# Patient Record
Sex: Female | Born: 1985 | Race: White | Hispanic: No | Marital: Single | Smoking: Former smoker
Health system: Southern US, Community
[De-identification: ages and names within clinical notes are randomized; demographics above are authoritative.]

## PROBLEM LIST (undated history)

## (undated) DIAGNOSIS — N83209 Unspecified ovarian cyst, unspecified side: Secondary | ICD-10-CM

## (undated) DIAGNOSIS — IMO0002 Reserved for concepts with insufficient information to code with codable children: Secondary | ICD-10-CM

## (undated) DIAGNOSIS — K661 Hemoperitoneum: Secondary | ICD-10-CM

## (undated) HISTORY — DX: Unspecified ovarian cyst, unspecified side: N83.209

## (undated) HISTORY — PX: BREAST SURGERY: SHX581

## (undated) HISTORY — DX: Hemoperitoneum: K66.1

---

## 2003-02-03 HISTORY — PX: BREAST BIOPSY: SHX20

## 2004-02-03 HISTORY — PX: DILATION AND CURETTAGE OF UTERUS: SHX78

## 2010-02-02 HISTORY — PX: ABDOMINAL EXPLORATION SURGERY: SHX538

## 2010-02-02 HISTORY — PX: BREAST ENHANCEMENT SURGERY: SHX7

## 2010-09-07 ENCOUNTER — Emergency Department (HOSPITAL_COMMUNITY): Payer: PPO

## 2010-09-07 ENCOUNTER — Inpatient Hospital Stay (HOSPITAL_COMMUNITY)
Admission: EM | Admit: 2010-09-07 | Discharge: 2010-09-11 | DRG: 742 | Disposition: A | Discharge status: Home / Self Care | Payer: PPO | Attending: General Surgery | Admitting: General Surgery

## 2010-09-07 DIAGNOSIS — D500 Iron deficiency anemia secondary to blood loss (chronic): Secondary | ICD-10-CM | POA: Diagnosis present

## 2010-09-07 DIAGNOSIS — K661 Hemoperitoneum: Secondary | ICD-10-CM

## 2010-09-07 DIAGNOSIS — R51 Headache: Secondary | ICD-10-CM | POA: Diagnosis present

## 2010-09-07 DIAGNOSIS — R109 Unspecified abdominal pain: Secondary | ICD-10-CM

## 2010-09-07 DIAGNOSIS — N83209 Unspecified ovarian cyst, unspecified side: Principal | ICD-10-CM | POA: Diagnosis present

## 2010-09-07 HISTORY — DX: Hemoperitoneum: K66.1

## 2010-09-07 LAB — CBC
HCT: 27.7 % — ABNORMAL LOW (ref 36.0–46.0)
HCT: 21.1 % — ABNORMAL LOW (ref 36.0–46.0)
Hemoglobin: 10 g/dL — ABNORMAL LOW (ref 12.0–15.0)
MCV: 84.1 fL (ref 78.0–100.0)
Platelets: 150 10*3/uL (ref 150–400)
RBC: 2.51 MIL/uL — ABNORMAL LOW (ref 3.87–5.11)
RDW: 12.1 % (ref 11.5–15.5)
WBC: 13.9 10*3/uL — ABNORMAL HIGH (ref 4.0–10.5)
WBC: 12.3 10*3/uL — ABNORMAL HIGH (ref 4.0–10.5)

## 2010-09-07 LAB — COMPREHENSIVE METABOLIC PANEL
AST: 11 U/L (ref 0–37)
Albumin: 3.3 g/dL — ABNORMAL LOW (ref 3.5–5.2)
Alkaline Phosphatase: 34 U/L — ABNORMAL LOW (ref 39–117)
BUN: 16 mg/dL (ref 6–23)
Chloride: 109 mEq/L (ref 96–112)
Potassium: 4.7 mEq/L (ref 3.5–5.1)
Total Bilirubin: 0.5 mg/dL (ref 0.3–1.2)

## 2010-09-07 LAB — DIFFERENTIAL
Basophils Absolute: 0 10*3/uL (ref 0.0–0.1)
Eosinophils Relative: 0 % (ref 0–5)
Lymphocytes Relative: 5 % — ABNORMAL LOW (ref 12–46)
Neutro Abs: 12.6 10*3/uL — ABNORMAL HIGH (ref 1.7–7.7)
Neutrophils Relative %: 91 % — ABNORMAL HIGH (ref 43–77)

## 2010-09-07 LAB — WET PREP, GENITAL

## 2010-09-07 LAB — URINALYSIS, ROUTINE W REFLEX MICROSCOPIC
Glucose, UA: NEGATIVE mg/dL
Leukocytes, UA: NEGATIVE
Specific Gravity, Urine: 1.033 — ABNORMAL HIGH (ref 1.005–1.030)
pH: 6 (ref 5.0–8.0)

## 2010-09-07 LAB — PREGNANCY, URINE: Preg Test, Ur: NEGATIVE

## 2010-09-07 LAB — LIPASE, BLOOD: Lipase: 18 U/L (ref 11–59)

## 2010-09-07 MED ORDER — IOHEXOL 300 MG/ML  SOLN
100.0000 mL | Freq: Once | INTRAMUSCULAR | Status: AC | PRN
  Administered 2010-09-07: 100 mL via INTRAVENOUS

## 2010-09-08 DIAGNOSIS — D62 Acute posthemorrhagic anemia: Secondary | ICD-10-CM

## 2010-09-08 LAB — CBC
Hemoglobin: 6.7 g/dL — CL (ref 12.0–15.0)
MCH: 29.8 pg (ref 26.0–34.0)
MCV: 82.2 fL (ref 78.0–100.0)
RBC: 2.25 MIL/uL — ABNORMAL LOW (ref 3.87–5.11)
WBC: 8.8 10*3/uL (ref 4.0–10.5)

## 2010-09-08 LAB — GC/CHLAMYDIA PROBE AMP, GENITAL
Chlamydia, DNA Probe: NEGATIVE
GC Probe Amp, Genital: NEGATIVE

## 2010-09-08 LAB — HEMOGLOBIN AND HEMATOCRIT, BLOOD
Hemoglobin: 6.7 g/dL — CL (ref 12.0–15.0)
Hemoglobin: 6.5 g/dL — CL (ref 12.0–15.0)

## 2010-09-09 LAB — CBC
MCH: 30 pg (ref 26.0–34.0)
MCHC: 35.6 g/dL (ref 30.0–36.0)
MCV: 84.3 fL (ref 78.0–100.0)
Platelets: 111 10*3/uL — ABNORMAL LOW (ref 150–400)
RDW: 12.8 % (ref 11.5–15.5)
WBC: 4.6 10*3/uL (ref 4.0–10.5)

## 2010-09-09 LAB — BASIC METABOLIC PANEL
BUN: 6 mg/dL (ref 6–23)
Calcium: 7.9 mg/dL — ABNORMAL LOW (ref 8.4–10.5)
Chloride: 106 mEq/L (ref 96–112)
Creatinine, Ser: 0.71 mg/dL (ref 0.50–1.10)
GFR calc Af Amer: >60 mL/min (ref >60)
GFR calc non Af Amer: >60 mL/min (ref >60)

## 2010-09-09 NOTE — Op Note (Signed)
NAMEMarland Kitchen  ASIANA, BENNINGER NO.:  [id number]  MEDICAL RECORD NO.:  [id number]  LOCATION:  1230                         FACILITY:  [facility]  PHYSICIAN:  Sandria Bales. Ezzard Standing, M.D.  DATE OF BIRTH:  09-25-1985  DATE OF PROCEDURE:  09/07/2010                              OPERATIVE REPORT  PREOPERATIVE DIAGNOSIS:  Hemoperitoneum.  POSTOP DIAGNOSIS:  Hemoperitoneum secondary to ruptured right ovarian cyst, 2500 mL of blood in abdominal cavity.  PROCEDURE:  Laparoscopic exploration with evacuation of hemoperitoneum, oversew of ruptured right ovarian cyst.  SURGEON:  Sandria Bales. Ezzard Standing, M.D.  FIRST ASSISTANT:  None.  ANESTHESIA:  General with 20 mL of 0.25% Marcaine.  COMPLICATIONS:  None.  INDICATION OF PROCEDURE:  Ms. Art is a 25 year old white female who is a patient of Dr. Marylene Land of [place] who presented with an acute onset of abdominal pain, hypotension, and a CT scan suggested of a hemoperitoneum.  I discussed with the patient and her boyfriend about proceeding with laparoscopic exploration.  I thought preoperatively this was probably a GYN source, and thought she would be best served by doing a laparoscopic exploration of two things. 1. To evacuate the hematoma. 2. To make a definitive diagnosis.  The risks of surgery include infection, open abdominal laparotomy, may not find the source of bleeding or stop the source of bleeding.  OPERATIVE NOTE:  Dr. Eilene Ghazi was supervising anesthesia.  The patient in room #1, she was placed in the lithotomy position and had a Foley catheter in place and a G-tube in place, given 1 g of Ancef.  The time-out was held and surgical checklist run.  I accessed the abdominal cavity through the infraumbilical incision with sharp dissection carried down the abdominal cavity.  I placed a 10 mm laparoscope through a 12 mm Hasson trocar, secured this up over with a 0 Vicryl suture.  I placed a 5 mm trocar in the right  lateral abdomen, 5 mm trocar in the left lateral abdomen.  I did an abdominal exploration. Upon entering the abdomen, the patient had a lot of blood in her abdomen.  I placed the patient in Trendelenburg position and evacuated the blood out of her pelvis. Most of the clotted blood centered around the right ovary.  I used both the larger sucker tip and the smaller sucker to get the blood out.  I then sucked out over the liver.  The liver looked good.  The gallbladder looked good.  The stomach looked good.  I sucked out over the spleen and I could see the anterior portion of the spleen and that also looked good.  Without any irrigation, I obtained about 2500 mL of clotted blood.  At this point, I started irrigating to define better the anatomy and it looked like her at the dome of the right ovary, she had an approximate 1 cm ruptured cyst which was bleeding.  I think this is the source of her blood loss.  I tried to cauterize this, but it was still dripping little bit of blood, so I used a 2-0 Vicryl suture in a figure-of-eight fashion, placed a piece of Surgicel under the stitch and sewed this  to the right ovary.  This stopped the bleeding.  I then irrigated the abdomen with about 3 L of saline, trying to get all clot blood out.  I re-examined the pelvis.  The right ovary was unremarkable.  The uterus was unremarkable.  The colon that I could see was unremarkable.  The bowel that I could see was unremarkable.  The omentum that I could see was unremarkable.  The right and left lobes of liver unremarkable.  The spleen was unremarkable.  The anterior wall of the stomach unremarkable. Again, I think the source of bleeding was the right ovary.  The trocar was then removed and turned, the umbilical port closed with 0 Vicryl suture.  The skin, I used about 20 mL of 4% Marcaine as a local anesthetic at the umbilicus and both small incisions.  Closed the skin with a 5-0 Vicryl suture, used  Dermabond on the skin.  The patient tolerated the procedure well, we will check a blood count recovery and follow her hemoglobin.   Sandria Bales. Ezzard Standing, M.D., FACS   DHN/MEDQ  D:  09/07/2010  T:  09/08/2010  Job:  [id number]  cc:   Marylene Land, [facility] Kathryne Sharper  Electronically Signed by Ovidio Kin M.D. on 09/09/2010 04:00:23 PM
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
## 2010-09-10 LAB — BASIC METABOLIC PANEL
Calcium: 8.4 mg/dL (ref 8.4–10.5)
GFR calc Af Amer: >60 mL/min (ref >60)
GFR calc non Af Amer: >60 mL/min (ref >60)
Potassium: 3.7 mEq/L (ref 3.5–5.1)
Sodium: 140 mEq/L (ref 135–145)

## 2010-09-10 LAB — CBC
MCH: 29.9 pg (ref 26.0–34.0)
MCHC: 35.3 g/dL (ref 30.0–36.0)
RDW: 12.3 % (ref 11.5–15.5)

## 2010-09-11 LAB — CROSSMATCH
ABO/RH(D): A POS
Antibody Screen: NEGATIVE
Unit division: 0
Unit division: 0

## 2010-09-22 ENCOUNTER — Encounter (INDEPENDENT_AMBULATORY_CARE_PROVIDER_SITE_OTHER): Payer: Self-pay | Admitting: Surgery

## 2010-09-23 ENCOUNTER — Encounter (INDEPENDENT_AMBULATORY_CARE_PROVIDER_SITE_OTHER): Payer: Self-pay | Admitting: Surgery

## 2010-09-23 NOTE — Discharge Summary (Signed)
NAMEMarland Erickson  Erickson, Katherine NO.:  [id number]  MEDICAL RECORD NO.:  [id number]  LOCATION:  1230                         FACILITY:  [facility]  PHYSICIAN:  Lodema Pilot, MD       DATE OF BIRTH:  30-May-1985  DATE OF ADMISSION:  09/07/2010 DATE OF DISCHARGE:  09/11/2010                              DISCHARGE SUMMARY   ADMISSION DIAGNOSIS:  Abdominal pain with hemoperitoneum.  DISCHARGE DIAGNOSIS:  Hemoperitoneum secondary to ruptured right ovarian cyst with 2500 mL blood in the abdominal cavity.  PROCEDURES:  Laparoscopic exploration and evacuation of hemoperitoneum and oversewing of right ruptured ovarian cyst on September 07, 2010, by Dr. Ezzard Standing.  BRIEF HISTORY:  The patient is a 25 year old white female, followed by Dr. Marylene Land in [facility].  Night of admission, she thought she was having some menstrual cramps.  She tried some ibuprofen, which did not help.  The following morning, she collapsed in the Erickson, but she tried to get up and collapse again.  She finally called EMS around 12 o'clock, at 1 was brought to the emergency room at [facility].  She had abdominal distention and discomfort with no fever.  She was seen in evaluation by Dr. Ezzard Standing and he recommended she go to the OR emergently for exploration.  She has a history of miscarriage about 6 years ago, underwent a D and C while in [place].  She  has a 3-year- old girl, born by vaginal delivery in [place].  For further history and physical, please see the dictated note.  HOSPITAL COURSE:  The patient was admitted, taken to the OR with the above-noted findings.  The hemorrhage was controlled and the patient was transferred back to the ICU.  She was seen the following morning by Dr. Biagio Quint.  She was hemodynamically stable, but had a fairly significant anemia with hemoglobin 6.7 and hematocrit of 18.5.  We followed her hemoglobin throughout the day and it did not drop  significantly.  It was 6.3 and 17.7 on September 08, 2008, and the last repeat done on August 8 was 6.7, hemoglobin of 19, her electrolytes are normal.  She had remained hemodynamically stable.  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  She does complain of headache, which has improved, but it was her major problem, initially, she also is easily fatigued with any activity including eating right now.  Dr. Biagio Quint considered transfusion, but it was his opinion that she was hemodynamically stable and it was not necessary at this time.  The patient has made slow steady progress.  She has been mobilized.  Her blood pressure still runs between 98/41 up to 112/53.  She is tired, but her incisions looked good.  She is passing gas, tolerating p.o.'s well, and was his opinion she be discharged home on September 11, 2010.  The patient has been placed on multivitamin and iron.  We gave her ibuprofen and Vicodin p.r.n. for pain.  She will return on September 25, 2010, follow up with Dr. Ezzard Standing.  We recommended she return to her family practice doctor and let him follow her hemoglobin/hematocrit.  It was recommended she use milk of magnesia or MiraLax p.r.n. if she develops constipation especially while on the iron products.  CONDITION ON DISCHARGE:  Improved.     Eber Hong, P.A.   ______________________________ Lodema Pilot, MD    WDJ/MEDQ  D:  09/11/2010  T:  09/12/2010  Job:  [id number]  cc:   Dr. Marylene Land  Electronically Signed by Sherrie George P.A. on 09/19/2010 11:21:25 PM Electronically Signed by Lodema Pilot DO on 09/23/2010 09:44:27 AM

## 2010-09-24 ENCOUNTER — Encounter (INDEPENDENT_AMBULATORY_CARE_PROVIDER_SITE_OTHER): Payer: PPO | Admitting: Surgery

## 2010-09-24 ENCOUNTER — Encounter (INDEPENDENT_AMBULATORY_CARE_PROVIDER_SITE_OTHER): Payer: Self-pay | Admitting: Surgery

## 2010-10-09 ENCOUNTER — Encounter (INDEPENDENT_AMBULATORY_CARE_PROVIDER_SITE_OTHER): Payer: Self-pay | Admitting: Surgery

## 2010-10-17 ENCOUNTER — Encounter (INDEPENDENT_AMBULATORY_CARE_PROVIDER_SITE_OTHER): Payer: Self-pay | Admitting: Surgery

## 2010-10-17 ENCOUNTER — Ambulatory Visit (INDEPENDENT_AMBULATORY_CARE_PROVIDER_SITE_OTHER): Payer: PPO | Admitting: Surgery

## 2010-10-17 VITALS — BP 90/60 | HR 66 | Ht 64.0 in | Wt 154.0 lb

## 2010-10-17 DIAGNOSIS — K661 Hemoperitoneum: Secondary | ICD-10-CM

## 2010-10-17 NOTE — Progress Notes (Signed)
Assessment and Plan: 1.  Hemoperitoneum secondary to ruptured right ovarian cyst  Doing well.  2. Some headaches.  About weekly.  HPI:  Katherine Erickson is a 25 year old white female who is a patient of Katherine Erickson, [place], of Katherine Erickson who presented with an acute onset of abdominal pain, hypotension, and a CT scan suggested of a  Hemoperitoneum.  I did a laparoscopy on her 09/07/2010 and evacuated the hemoperitoneum.  She had a bleeding right ovarian cysts.  Her post op course has been uneventful.  She had a period about 4 days after leaving the hospital.  She says her appetite is good (in fact, it is very good).  PE: BP 90/60  Pulse 66  Ht 5\' 4"  (1.626 m)  Wt 154 lb (69.854 kg)  BMI 26.43 kg/m2  Abdomen:  All incisions well healed.  Soft.

## 2010-12-16 NOTE — Progress Notes (Signed)
This encounter was created in error - please disregard.

## 2012-02-03 HISTORY — PX: COLPOSCOPY: SHX161

## 2012-02-03 NOTE — L&D Delivery Note (Signed)
Delivery Note  SVD viable female Apgars 8,9 over intact perineum.  Placenta delivered spontaneously intact with 3VC. good support and hemostasis noted and R/V exam confirms.  PH art was sent.  [place] cord blood was not done.  Mother and baby were doing well.  EBL 300cc  Candice Camp, MD

## 2012-04-27 LAB — OB RESULTS CONSOLE RUBELLA ANTIBODY, IGM: Rubella: NON-IMMUNE/NOT IMMUNE

## 2012-04-27 LAB — OB RESULTS CONSOLE HIV ANTIBODY (ROUTINE TESTING): HIV: NONREACTIVE

## 2012-04-27 LAB — OB RESULTS CONSOLE ABO/RH: RH Type: POSITIVE

## 2012-11-19 ENCOUNTER — Encounter (HOSPITAL_COMMUNITY): Payer: Self-pay | Admitting: *Deleted

## 2012-11-19 ENCOUNTER — Inpatient Hospital Stay (HOSPITAL_COMMUNITY)
Admission: AD | Admit: 2012-11-19 | Discharge: 2012-11-21 | DRG: 775 | Disposition: A | Discharge status: Home / Self Care | Payer: Managed Care, Other (non HMO) | Source: Ambulatory Visit | Attending: Obstetrics and Gynecology | Admitting: Obstetrics and Gynecology

## 2012-11-19 HISTORY — DX: Reserved for concepts with insufficient information to code with codable children: IMO0002

## 2012-11-19 NOTE — MAU Note (Signed)
Was sitting down and felt a "pop" and started leaking fld. Cont to leak clear fld.

## 2012-11-20 ENCOUNTER — Inpatient Hospital Stay (HOSPITAL_COMMUNITY): Payer: Managed Care, Other (non HMO) | Admitting: Anesthesiology

## 2012-11-20 ENCOUNTER — Encounter (HOSPITAL_COMMUNITY): Payer: Managed Care, Other (non HMO) | Admitting: Anesthesiology

## 2012-11-20 ENCOUNTER — Encounter (HOSPITAL_COMMUNITY): Payer: Self-pay | Admitting: *Deleted

## 2012-11-20 DIAGNOSIS — O4290 Premature rupture of membranes, unspecified as to length of time between rupture and onset of labor, unspecified weeks of gestation: Secondary | ICD-10-CM

## 2012-11-20 LAB — CBC
MCH: 28.8 pg (ref 26.0–34.0)
MCV: 80.8 fL (ref 78.0–100.0)
Platelets: 159 10*3/uL (ref 150–400)
RBC: 4.06 MIL/uL (ref 3.87–5.11)
RDW: 12.3 % (ref 11.5–15.5)
WBC: 9.3 10*3/uL (ref 4.0–10.5)

## 2012-11-20 LAB — OB RESULTS CONSOLE GBS: GBS: NEGATIVE

## 2012-11-20 LAB — SYPHILIS: RPR W/REFLEX TO RPR TITER AND TREPONEMAL ANTIBODIES, TRADITIONAL SCREENING AND DIAGNOSIS ALGORITHM: RPR Ser Ql: NONREACTIVE

## 2012-11-20 MED ORDER — ONDANSETRON HCL 4 MG/2ML IJ SOLN: 4.0000 mg | Freq: Four times a day (QID) | INTRAMUSCULAR | Status: DC | PRN

## 2012-11-20 MED ORDER — PHENYLEPHRINE 40 MCG/ML (10ML) SYRINGE FOR IV PUSH (FOR BLOOD PRESSURE SUPPORT): 80.0000 ug | PREFILLED_SYRINGE | INTRAVENOUS | Status: DC | PRN

## 2012-11-20 MED ORDER — TETANUS-DIPHTH-ACELL PERTUSSIS 5-2.5-18.5 LF-MCG/0.5 IM SUSP
0.5000 mL | Freq: Once | INTRAMUSCULAR | Status: DC
  Filled 2012-11-20: qty 0.5

## 2012-11-20 MED ORDER — ACETAMINOPHEN 325 MG PO TABS: 650.0000 mg | ORAL_TABLET | ORAL | Status: DC | PRN

## 2012-11-20 MED ORDER — LACTATED RINGERS IV SOLN: 500.0000 mL | Freq: Once | INTRAVENOUS | Status: DC

## 2012-11-20 MED ORDER — DIPHENHYDRAMINE HCL 25 MG PO CAPS: 25.0000 mg | ORAL_CAPSULE | Freq: Four times a day (QID) | ORAL | Status: DC | PRN

## 2012-11-20 MED ORDER — DIBUCAINE 1 % RE OINT
1.0000 "application " | TOPICAL_OINTMENT | RECTAL | Status: DC | PRN
  Filled 2012-11-20: qty 28

## 2012-11-20 MED ORDER — PRENATAL MULTIVITAMIN CH
1.0000 | ORAL_TABLET | Freq: Every day | ORAL | Status: DC
  Administered 2012-11-21: 1 via ORAL
  Filled 2012-11-20: qty 1

## 2012-11-20 MED ORDER — IBUPROFEN 600 MG PO TABS
600.0000 mg | ORAL_TABLET | Freq: Four times a day (QID) | ORAL | Status: DC
  Administered 2012-11-21 (×4): 600 mg via ORAL
  Filled 2012-11-20 (×4): qty 1

## 2012-11-20 MED ORDER — EPHEDRINE 5 MG/ML INJ: 10.0000 mg | INTRAVENOUS | Status: DC | PRN

## 2012-11-20 MED ORDER — FLEET ENEMA 7-19 GM/118ML RE ENEM: 1.0000 | ENEMA | RECTAL | Status: DC | PRN

## 2012-11-20 MED ORDER — SENNOSIDES-DOCUSATE SODIUM 8.6-50 MG PO TABS
2.0000 | ORAL_TABLET | ORAL | Status: DC
  Administered 2012-11-21: 2 via ORAL
  Filled 2012-11-20: qty 2

## 2012-11-20 MED ORDER — DIPHENHYDRAMINE HCL 50 MG/ML IJ SOLN: 12.5000 mg | INTRAMUSCULAR | Status: DC | PRN

## 2012-11-20 MED ORDER — OXYTOCIN BOLUS FROM INFUSION: 500.0000 mL | INTRAVENOUS | Status: DC

## 2012-11-20 MED ORDER — OXYCODONE-ACETAMINOPHEN 5-325 MG PO TABS: 1.0000 | ORAL_TABLET | ORAL | Status: DC | PRN

## 2012-11-20 MED ORDER — OXYTOCIN 40 UNITS IN LACTATED RINGERS INFUSION - SIMPLE MED
1.0000 m[IU]/min | INTRAVENOUS | Status: DC
  Administered 2012-11-20: 2 m[IU]/min via INTRAVENOUS
  Filled 2012-11-20: qty 1000

## 2012-11-20 MED ORDER — CITRIC ACID-SODIUM CITRATE 334-500 MG/5ML PO SOLN
30.0000 mL | ORAL | Status: DC | PRN
  Administered 2012-11-20: 30 mL via ORAL
  Filled 2012-11-20: qty 15

## 2012-11-20 MED ORDER — TERBUTALINE SULFATE 1 MG/ML IJ SOLN: 0.2500 mg | Freq: Once | INTRAMUSCULAR | Status: DC | PRN

## 2012-11-20 MED ORDER — MEDROXYPROGESTERONE ACETATE 150 MG/ML IM SUSP: 150.0000 mg | INTRAMUSCULAR | Status: DC | PRN

## 2012-11-20 MED ORDER — EPHEDRINE 5 MG/ML INJ
INTRAVENOUS | Status: AC
  Filled 2012-11-20: qty 4

## 2012-11-20 MED ORDER — ONDANSETRON HCL 4 MG PO TABS: 4.0000 mg | ORAL_TABLET | ORAL | Status: DC | PRN

## 2012-11-20 MED ORDER — LACTATED RINGERS IV SOLN
500.0000 mL | INTRAVENOUS | Status: DC | PRN
  Administered 2012-11-20: 1000 mL via INTRAVENOUS

## 2012-11-20 MED ORDER — SIMETHICONE 80 MG PO CHEW: 80.0000 mg | CHEWABLE_TABLET | ORAL | Status: DC | PRN

## 2012-11-20 MED ORDER — PHENYLEPHRINE 40 MCG/ML (10ML) SYRINGE FOR IV PUSH (FOR BLOOD PRESSURE SUPPORT)
PREFILLED_SYRINGE | INTRAVENOUS | Status: AC
  Filled 2012-11-20: qty 5

## 2012-11-20 MED ORDER — ZOLPIDEM TARTRATE 5 MG PO TABS: 5.0000 mg | ORAL_TABLET | Freq: Every evening | ORAL | Status: DC | PRN

## 2012-11-20 MED ORDER — SODIUM CHLORIDE 0.9 % IV SOLN
INTRAVENOUS | Status: DC
  Administered 2012-11-20: 17:00:00 via EPIDURAL
  Filled 2012-11-20 (×3): qty 12.5

## 2012-11-20 MED ORDER — BENZOCAINE-MENTHOL 20-0.5 % EX AERO
1.0000 "application " | INHALATION_SPRAY | CUTANEOUS | Status: DC | PRN
  Filled 2012-11-20: qty 56

## 2012-11-20 MED ORDER — MEASLES, MUMPS & RUBELLA VAC ~~LOC~~ INJ
0.5000 mL | INJECTION | Freq: Once | SUBCUTANEOUS | Status: AC
  Administered 2012-11-21: 0.5 mL via SUBCUTANEOUS
  Filled 2012-11-20 (×2): qty 0.5

## 2012-11-20 MED ORDER — LANOLIN HYDROUS EX OINT: TOPICAL_OINTMENT | CUTANEOUS | Status: DC | PRN

## 2012-11-20 MED ORDER — FENTANYL 2.5 MCG/ML BUPIVACAINE 1/10 % EPIDURAL INFUSION (WH - ANES)
INTRAMUSCULAR | Status: AC
  Filled 2012-11-20: qty 125

## 2012-11-20 MED ORDER — ONDANSETRON HCL 4 MG/2ML IJ SOLN: 4.0000 mg | INTRAMUSCULAR | Status: DC | PRN

## 2012-11-20 MED ORDER — WITCH HAZEL-GLYCERIN EX PADS: 1.0000 "application " | MEDICATED_PAD | CUTANEOUS | Status: DC | PRN

## 2012-11-20 MED ORDER — BUPIVACAINE HCL (PF) 0.25 % IJ SOLN
INTRAMUSCULAR | Status: DC | PRN
  Administered 2012-11-20: 10 mL via EPIDURAL

## 2012-11-20 MED ORDER — IBUPROFEN 600 MG PO TABS: 600.0000 mg | ORAL_TABLET | Freq: Four times a day (QID) | ORAL | Status: DC | PRN

## 2012-11-20 MED ORDER — FENTANYL 2.5 MCG/ML BUPIVACAINE 1/10 % EPIDURAL INFUSION (WH - ANES)
14.0000 mL/h | INTRAMUSCULAR | Status: DC | PRN
  Administered 2012-11-20: 14 mL/h via EPIDURAL

## 2012-11-20 MED ORDER — OXYTOCIN 40 UNITS IN LACTATED RINGERS INFUSION - SIMPLE MED
62.5000 mL/h | INTRAVENOUS | Status: DC
  Administered 2012-11-20: 999 mL/h via INTRAVENOUS

## 2012-11-20 MED ORDER — LIDOCAINE HCL (PF) 1 % IJ SOLN
30.0000 mL | INTRAMUSCULAR | Status: DC | PRN
  Filled 2012-11-20: qty 30

## 2012-11-20 MED ORDER — LIDOCAINE HCL (PF) 1 % IJ SOLN
INTRAMUSCULAR | Status: DC | PRN
  Administered 2012-11-20 (×2): 5 mL

## 2012-11-20 MED ORDER — LACTATED RINGERS IV SOLN
INTRAVENOUS | Status: DC
  Administered 2012-11-20 (×2): via INTRAVENOUS

## 2012-11-20 NOTE — Progress Notes (Signed)
79.5 mL of Fentanyl WIS, witnessed by Sandrea Hughs RN.

## 2012-11-20 NOTE — Anesthesia Preprocedure Evaluation (Signed)

## 2012-11-20 NOTE — Anesthesia Procedure Notes (Signed)
Epidural Patient location during procedure: OB Start time: 11/20/2012 2:12 PM  Staffing Anesthesiologist: Angus Seller., Harrell Gave. Performed by: anesthesiologist   Preanesthetic Checklist Completed: patient identified, site marked, surgical consent, pre-op evaluation, timeout performed, IV checked, risks and benefits discussed and monitors and equipment checked  Epidural Patient position: sitting Prep: site prepped and draped and DuraPrep Patient monitoring: continuous pulse ox and blood pressure Approach: midline Injection technique: LOR air  Needle:  Needle type: Tuohy  Needle gauge: 17 G Needle length: 9 cm and 9 Needle insertion depth: 5 cm cm Catheter type: closed end flexible Catheter size: 19 Gauge Catheter at skin depth: 10 cm Test dose: negative  Assessment Events: blood not aspirated, injection not painful, no injection resistance, negative IV test and no paresthesia  Additional Notes Patient identified.  Risk benefits discussed including failed block, incomplete pain control, headache, nerve damage, paralysis, blood pressure changes, nausea, vomiting, reactions to medication both toxic or allergic, and postpartum back pain.  Patient expressed understanding and wished to proceed.  All questions were answered.  Sterile technique used throughout procedure and epidural site dressed with sterile barrier dressing. No paresthesia or other complications noted.The patient did not experience any signs of intravascular injection such as tinnitus or metallic taste in mouth nor signs of intrathecal spread such as rapid motor block. Please see nursing notes for vital signs.

## 2012-11-20 NOTE — H&P (Signed)
Katherine Erickson is a 27 y.o. female presenting for SROM and early labor.  Pregnancy uncomplicated. History OB History   Grav Para Term Preterm Abortions TAB SAB Ect Mult Living   3 1 1  1  1   1      Past Medical History  Diagnosis Date  . Hemoperitoneum 09/07/2010  . Ruptured ovarian cyst 08/05/212  . Abnormal Pap smear    Past Surgical History  Procedure Laterality Date  . Dilation and curettage of uterus  2006  . Abdominal exploration surgery  2012    ruptured ovarian cyst  . Breast surgery    . Colposcopy  2014  . Breast enhancement surgery  2012  . Breast biopsy  2005   Family History: family history includes Cancer in her father; Diabetes in her mother; Hypertension in her mother; Stroke in her mother. Social History:  reports that she quit smoking about 4 years ago. She does not have any smokeless tobacco history on file. She reports that she does not drink alcohol or use illicit drugs.   Prenatal Transfer Tool  Maternal Diabetes: No Genetic Screening: Normal Maternal Ultrasounds/Referrals: Normal Fetal Ultrasounds or other Referrals:  None Maternal Substance Abuse:  No Significant Maternal Medications:  None Significant Maternal Lab Results:  None Other Comments:  None  ROS  Dilation: 1.5 Effacement (%): 50 Station: -3 Exam by:: Artelia Laroche CNM Blood pressure 115/69, pulse 97, temperature 98.4 F (36.9 C), temperature source Axillary, resp. rate 24, height 5\' 4"  (1.626 m), weight 90.992 kg (200 lb 9.6 oz). Exam Physical Exam  Prenatal labs: ABO, Rh: A/Positive/-- (03/26 0000) Antibody: Negative (03/26 0000) Rubella: Nonimmune (03/26 0000) RPR: Nonreactive (03/26 0000)  HBsAg: Negative (03/26 0000)  HIV: Non-reactive (03/26 0000)  GBS: Negative (10/19 0000)   Assessment/Plan: IUP at term with SROM.  Will augment with pitocin.  Anticipate SVD   Brando Taves C 11/20/2012, 9:52 AM

## 2012-11-20 NOTE — MAU Provider Note (Signed)
Asked to do speculum exam to rule out ROM  Small pooling noted + ferning  Cervix  Dilation: 1.5 Effacement (%): 50 Station: -3 Presentation: Vertex Exam by:: Artelia Laroche CNM  Will have RN contact Dr Rana Snare

## 2012-11-21 LAB — CBC
HCT: 32.3 % — ABNORMAL LOW (ref 36.0–46.0)
MCHC: 34.7 g/dL (ref 30.0–36.0)
Platelets: 163 10*3/uL (ref 150–400)
RBC: 4 MIL/uL (ref 3.87–5.11)
RDW: 12.4 % (ref 11.5–15.5)
WBC: 11.4 10*3/uL — ABNORMAL HIGH (ref 4.0–10.5)

## 2012-11-21 MED ORDER — MEASLES, MUMPS & RUBELLA VAC ~~LOC~~ INJ: 0.5000 mL | INJECTION | Freq: Once | SUBCUTANEOUS | Status: AC

## 2012-11-21 NOTE — Progress Notes (Signed)
Post Partum Day 1 Subjective: no complaints wants to go home today  Objective: Blood pressure 108/67, pulse 84, temperature 97.6 F (36.4 C), temperature source Oral, resp. rate 20, height 5\' 4"  (1.626 m), weight 90.992 kg (200 lb 9.6 oz), SpO2 95.00%, unknown if currently breastfeeding.  Physical Exam:  General: alert, cooperative and appears stated age Lochia: appropriate Uterine Fundus: firm Incision: healing well, no significant drainage, no dehiscence DVT Evaluation: No evidence of DVT seen on physical exam.   Recent Labs  11/20/12 0059 11/21/12 0600  HGB 11.7* 11.2*  HCT 32.8* 32.3*    Assessment/Plan: Discharge home   LOS: 2 days   Katherine Erickson 11/21/2012, 7:56 AM

## 2012-11-21 NOTE — Discharge Summary (Signed)
Obstetric Discharge Summary Reason for Admission: rupture of membranes Prenatal Procedures: none Intrapartum Procedures: spontaneous vaginal delivery Postpartum Procedures: none Complications-Operative and Postpartum: none Hemoglobin  Date Value Range Status  11/21/2012 11.2* 12.0 - 15.0 g/dL Final     HCT  Date Value Range Status  11/21/2012 32.3* 36.0 - 46.0 % Final    Physical Exam:  General: alert, cooperative and appears stated age 27: appropriate Uterine Fundus: firm Incision: healing well, no significant drainage DVT Evaluation: No evidence of DVT seen on physical exam.  Discharge Diagnoses: Term Pregnancy-delivered  Discharge Information: Date: 11/21/2012 Activity: pelvic rest Diet: routine Medications: None Condition: improved Instructions: refer to practice specific booklet Discharge to: home   Newborn Data: Live born female  Birth Weight: 6 lb 2.9 oz (2805 g) APGAR: 9, 9  Home with mother.  Daxter Paule L 11/21/2012, 7:57 AM

## 2012-11-21 NOTE — Anesthesia Postprocedure Evaluation (Signed)
Anesthesia Post Note  Patient: Katherine Erickson  Procedure(s) Performed: * No procedures listed *  Anesthesia type: Epidural  Patient location: Mother/Baby  Post pain: Pain level controlled  Post assessment: Post-op Vital signs reviewed  Last Vitals:  Filed Vitals:   11/21/12 0024  BP: 108/67  Pulse: 84  Temp: 36.4 C  Resp: 20    Post vital signs: Reviewed  Level of consciousness: awake  Complications: No apparent anesthesia complications

## 2012-11-23 IMAGING — CT CT ABD-PELV W/ CM
2 of 4 series · 17 of 46 positions shown, 19 images · IV contrast (APPLIED)
Comparison: None.

CLINICAL DATA: Lower abdominal pain, hypotension, dysuria,
nausea/vomiting

CT ABDOMEN AND PELVIS WITH CONTRAST
TECHNIQUE: Multidetector CT imaging of the abdomen and pelvis was
performed following the standard protocol during bolus
administration of intravenous contrast.
Contrast: 100 ml 1mnipaque-MOO IV

[Series 2: abd_pel 5.0 b40f st · axial · 0.71mm/px · z∈[-542,-98]mm · 14 of 99 slices shown, 16 images]
[im 5/99  soft-tissue]
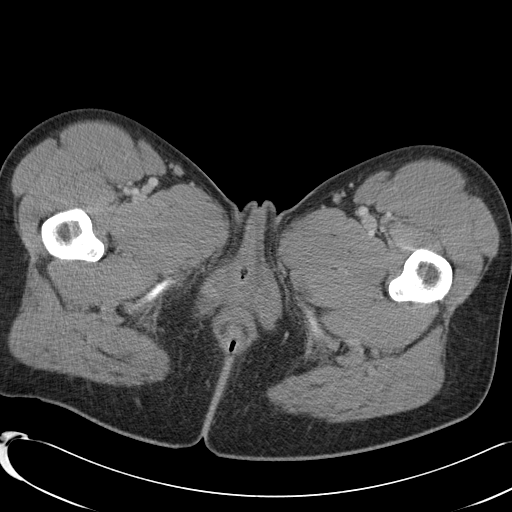
[im 5/99  bone]
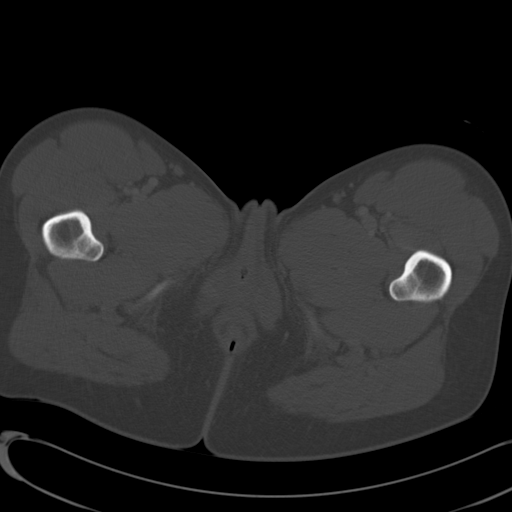
[im 13/99  soft-tissue]
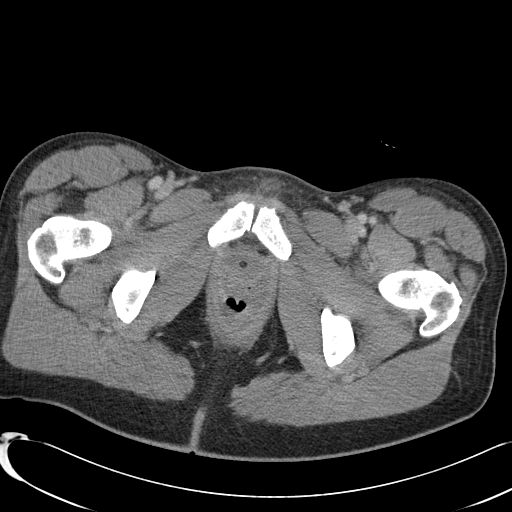
[im 18/99  soft-tissue]
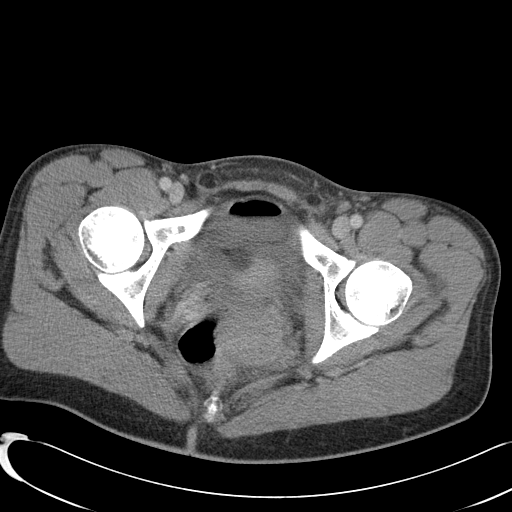
[im 26/99  soft-tissue]
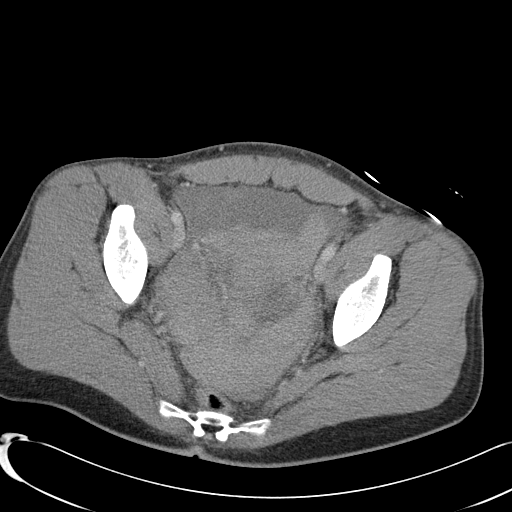
[im 35/99  soft-tissue]
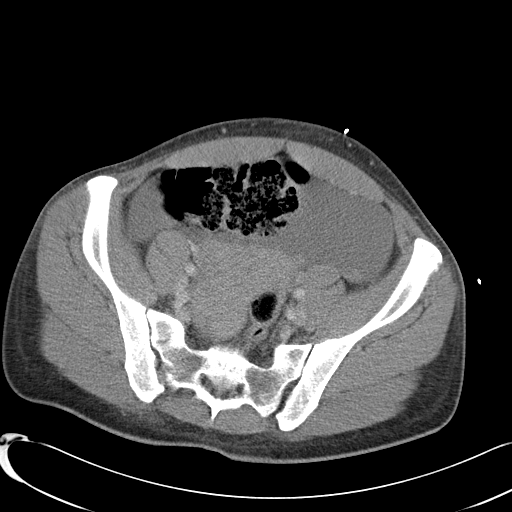
[im 39/99  soft-tissue]
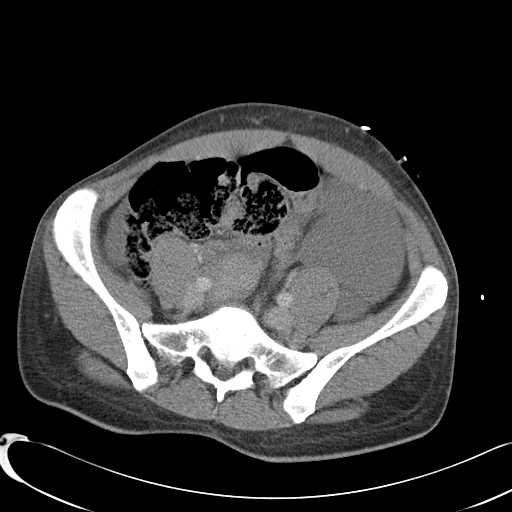
[im 47/99  soft-tissue]
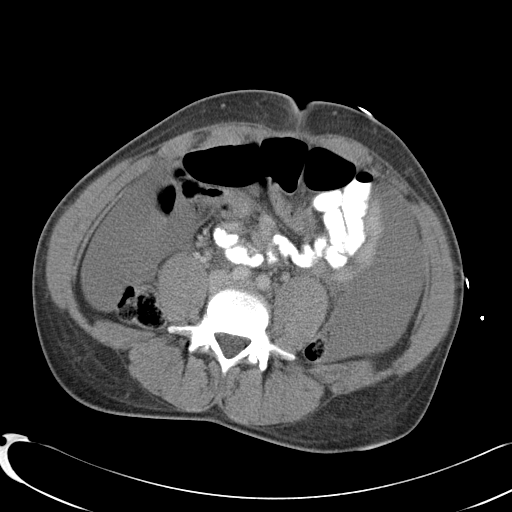
[im 52/99  soft-tissue]
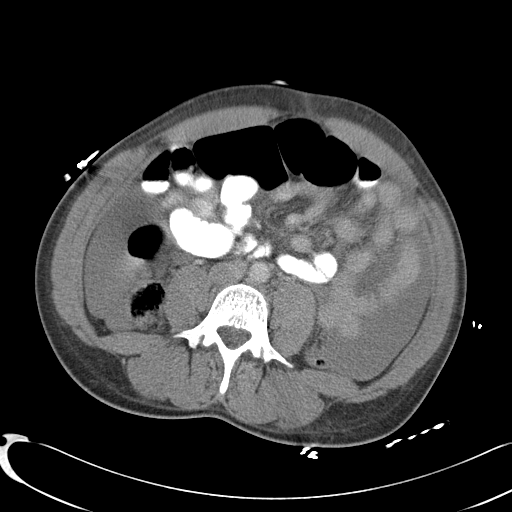
[im 60/99  soft-tissue]
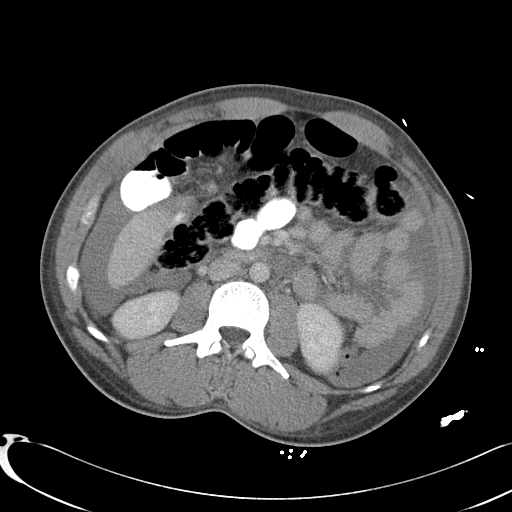
[im 60/99  bone]
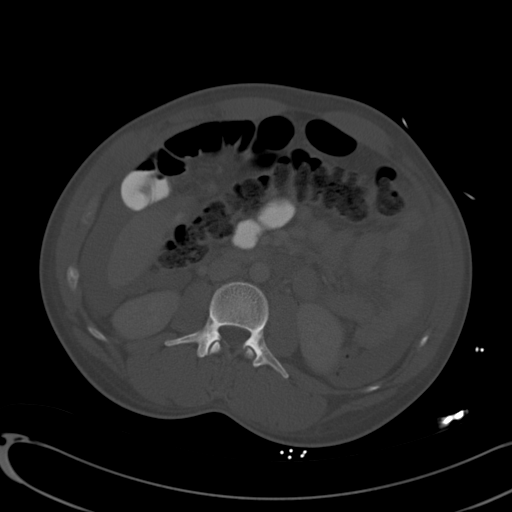
[im 64/99  soft-tissue]
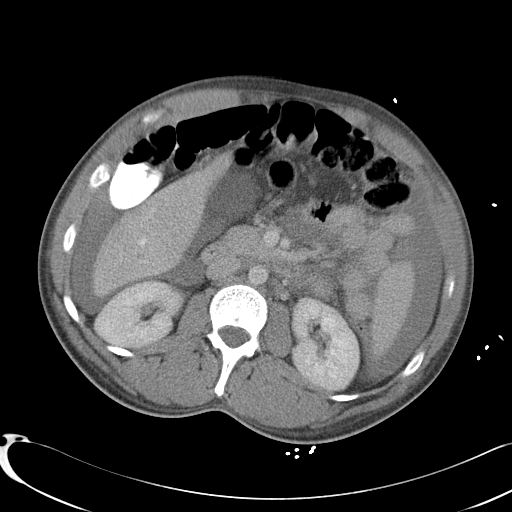
[im 73/99  soft-tissue]
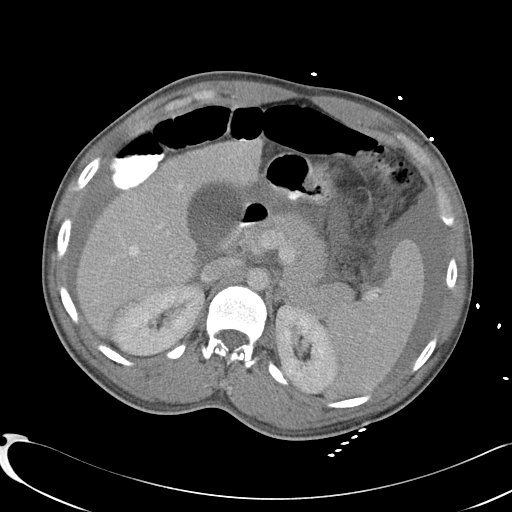
[im 81/99  soft-tissue]
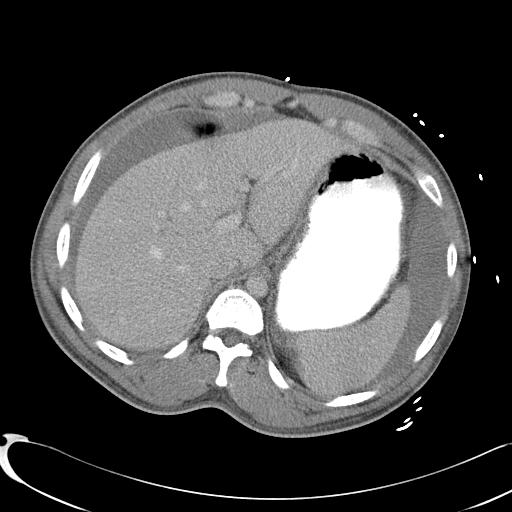
[im 86/99  soft-tissue]
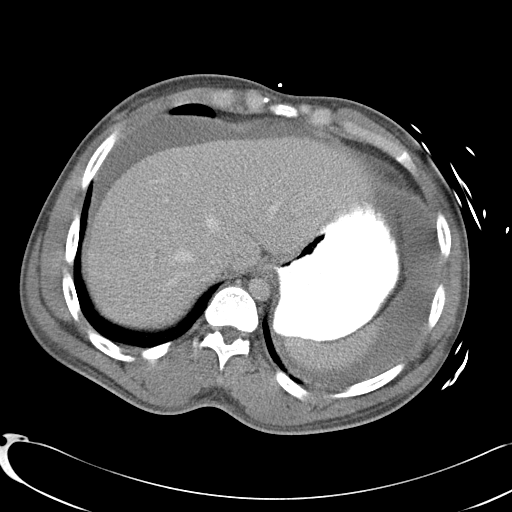
[im 94/99  soft-tissue]
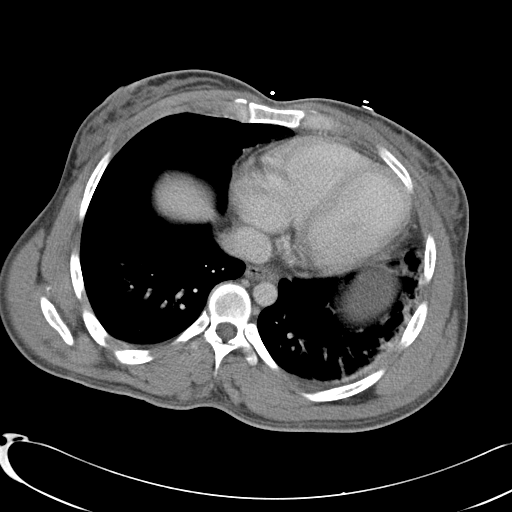

[Series 602: coronal · coronal · 1.00mm/px · 3 of 70 slices shown]
[im 24/70  soft-tissue]
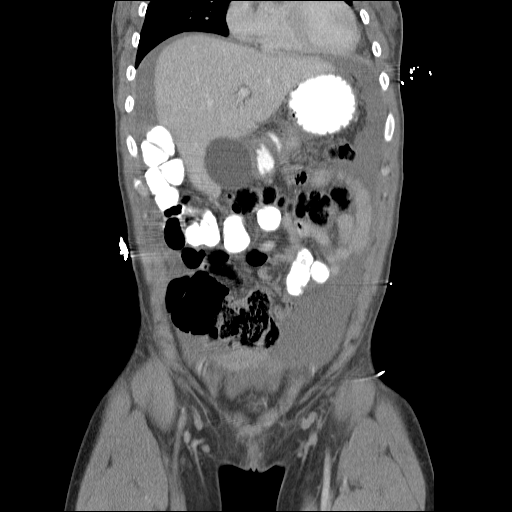
[im 31/70  soft-tissue]
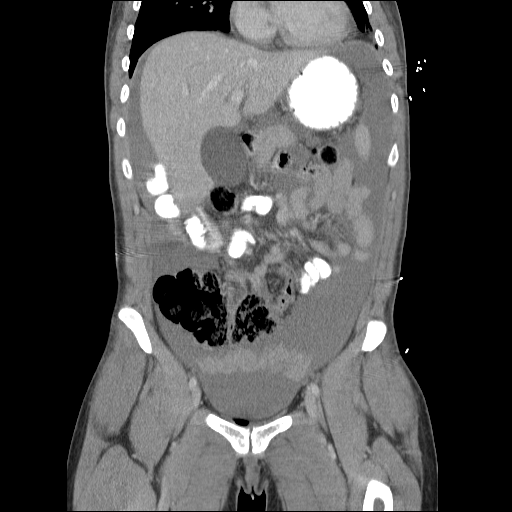
[im 39/70  soft-tissue]
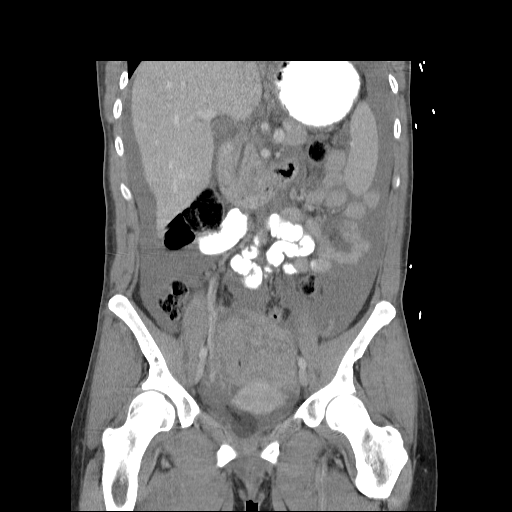

[17 of 46 positions shown; findings below may reference images not displayed]

FINDINGS: Mild patchy opacity at the lateral left lung base,
possibly infectious.

Liver, spleen, pancreas, and adrenal glands are within normal
limits.

Gallbladder is unremarkable.  No intrahepatic or extrahepatic
ductal dilatation.

Kidneys are within normal limts.  No hydronephrosis.

No evidence of bowel obstruction.

Large volume abdominopelvic ascites.

Large amount of high-density material layering in the dependent
abdomen/pelvis, worrisome for hemoperitoneum (series 2/image 72).

No evidence of abdominal aortic aneurysm.

Bladder is notable for gas and indwelling Foley catheter.

Uterus is unremarkable.  Ovaries are not discretely visualized
secondary to suspected hemoperitoneum.

Visualized osseous structures are within normal limits.
IMPRESSION: High-density material layering in the dependent abdomen/pelvis,
worrisome for hemoperitoneum, etiology uncertain.  Surgical
consultation is suggested.

Large volume abdominopelvic ascites.

Ovaries not discretely visualized.

Critical Value/emergent results were called by telephone at the
time of interpretation on 09/07/2010  at 3318 hours  to  Dr. Gibbison
Kujovic Sallustio in the ED, who verbally acknowledged these results.

## 2013-12-04 ENCOUNTER — Encounter (HOSPITAL_COMMUNITY): Payer: Self-pay | Admitting: *Deleted

## 2014-11-30 ENCOUNTER — Emergency Department (HOSPITAL_COMMUNITY)
Admission: EM | Admit: 2014-11-30 | Discharge: 2014-11-30 | Disposition: A | Discharge status: Home / Self Care | Payer: Managed Care, Other (non HMO) | Attending: Emergency Medicine | Admitting: Emergency Medicine

## 2014-11-30 ENCOUNTER — Emergency Department (HOSPITAL_COMMUNITY): Payer: Managed Care, Other (non HMO)

## 2014-11-30 ENCOUNTER — Encounter (HOSPITAL_COMMUNITY): Payer: Self-pay | Admitting: Emergency Medicine

## 2014-11-30 DIAGNOSIS — Y9289 Other specified places as the place of occurrence of the external cause: Secondary | ICD-10-CM | POA: Insufficient documentation

## 2014-11-30 DIAGNOSIS — W228XXA Striking against or struck by other objects, initial encounter: Secondary | ICD-10-CM | POA: Insufficient documentation

## 2014-11-30 DIAGNOSIS — Z79899 Other long term (current) drug therapy: Secondary | ICD-10-CM | POA: Insufficient documentation

## 2014-11-30 DIAGNOSIS — Z8742 Personal history of other diseases of the female genital tract: Secondary | ICD-10-CM | POA: Insufficient documentation

## 2014-11-30 DIAGNOSIS — Z87891 Personal history of nicotine dependence: Secondary | ICD-10-CM | POA: Insufficient documentation

## 2014-11-30 DIAGNOSIS — Y998 Other external cause status: Secondary | ICD-10-CM | POA: Insufficient documentation

## 2014-11-30 DIAGNOSIS — S92311A Displaced fracture of first metatarsal bone, right foot, initial encounter for closed fracture: Secondary | ICD-10-CM | POA: Insufficient documentation

## 2014-11-30 DIAGNOSIS — Y9389 Activity, other specified: Secondary | ICD-10-CM | POA: Insufficient documentation

## 2014-11-30 DIAGNOSIS — S92901A Unspecified fracture of right foot, initial encounter for closed fracture: Secondary | ICD-10-CM

## 2014-11-30 DIAGNOSIS — Z8719 Personal history of other diseases of the digestive system: Secondary | ICD-10-CM | POA: Insufficient documentation

## 2014-11-30 MED ORDER — NAPROXEN 375 MG PO TABS: 375.0000 mg | ORAL_TABLET | Freq: Two times a day (BID) | ORAL | Status: AC

## 2014-11-30 MED ORDER — TRAMADOL HCL 50 MG PO TABS: 50.0000 mg | ORAL_TABLET | Freq: Four times a day (QID) | ORAL | Status: AC | PRN
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
## 2014-11-30 NOTE — ED Provider Notes (Signed)
CSN: [id number]     Arrival date & time 11/30/14  2108 History  By signing my name below, I, Gonzella Lex, attest that this documentation has been prepared under the direction and in the presence of Arthor Captain, PA-C Electronically Signed: Gonzella Lex, Scribe. 11/30/2014. 9:55 PM.    Chief Complaint  Patient presents with  . Foot Injury     The history is provided by the patient. No language interpreter was used.    HPI Comments: Katherine Erickson is a 29 y.o. female who presents to the Emergency Department complaining of moderate to severe pain to her right foot following injury this evening. Pt reports she kicked a gas can while playing kick ball. Pt has no other complaints at this time. No alleviating factors noted.    Past Medical History  Diagnosis Date  . Hemoperitoneum 09/07/2010  . Ruptured ovarian cyst 08/05/212  . Abnormal Pap smear    Past Surgical History  Procedure Laterality Date  . Dilation and curettage of uterus  2006  . Abdominal exploration surgery  2012    ruptured ovarian cyst  . Breast surgery    . Colposcopy  2014  . Breast enhancement surgery  2012  . Breast biopsy  2005   Family History  Problem Relation Age of Onset  . Diabetes Mother   . Stroke Mother   . Hypertension Mother   . Cancer Father     Lung   Social History  Substance Use Topics  . Smoking status: Former Smoker    Quit date: 10/16/2008  . Smokeless tobacco: None  . Alcohol Use: No   OB History    Gravida Para Term Preterm AB TAB SAB Ectopic Multiple Living   Review of Systems  Constitutional: Negative for fever and chills.  Musculoskeletal: Positive for myalgias and arthralgias.      Allergies  Review of patient's allergies indicates no known allergies.  Home Medications   Prior to Admission medications   Medication Sig Start Date End Date Taking? Authorizing Provider  Multiple Vitamin (MULTIVITAMIN WITH MINERALS) TABS tablet  Take 1 tablet by mouth daily.   Yes Historical Provider, MD  naproxen (NAPROSYN) 375 MG tablet Take 1 tablet (375 mg total) by mouth 2 (two) times daily. 11/30/14   Arthor Captain, PA-C  traMADol (ULTRAM) 50 MG tablet Take 1 tablet (50 mg total) by mouth every 6 (six) hours as needed. 11/30/14   Antionetta Ator, PA-C   BP 123/80 mmHg  Pulse 88  Temp(Src) 98 F (36.7 C) (Oral)  Resp 18  SpO2 98%  LMP 11/16/2014 Physical Exam  Constitutional: She is oriented to person, place, and time. She appears well-developed and well-nourished. No distress.  HENT:  Head: Normocephalic.  Eyes: Conjunctivae are normal.  Cardiovascular: Normal rate.   Pulmonary/Chest: Effort normal.  Abdominal: She exhibits no distension.  Musculoskeletal:  Full ROM in ankle Exquisitely tender to palpation on right foot NVI     Neurological: She is alert and oriented to person, place, and time.  Skin: Skin is warm and dry.  Psychiatric: She has a normal mood and affect.  Nursing note and vitals reviewed.   ED Course  Procedures  DIAGNOSTIC STUDIES:    Oxygen Saturation is 98% on room air, normal by my interpretation.   COORDINATION OF CARE:  9:48 PM Patient updated with results of x-ray. Will discharge patient with cam walker and  pain medications. Advise pt to rest, ice, compress, and elevate foot  Discussed treatment plan with pt at bedside and pt agreed to plan.     Labs Review Labs Reviewed - No data to display  Imaging Review Dg Foot Complete Right  11/30/2014  CLINICAL DATA:  Injury to right foot tonight, Jae DireKate an object with pain swelling right great toe and first metatarsal EXAM: RIGHT FOOT COMPLETE - 3+ VIEW COMPARISON:  None. FINDINGS: There is a minimally displaced fracture at the medial base of the first metatarsal. This extends into the tarsometatarsal joint. There are no other acute findings. IMPRESSION: Fracture at the base of the first metatarsal Electronically Signed   By: Esperanza Heiraymond   Rubner M.D.   On: 11/30/2014 21:40   I have personally reviewed and evaluated these images results as part of my medical decision-making.   EKG Interpretation None      MDM   Final diagnoses:  Foot fracture, right, closed, initial encounter    Patient with staple proximal first metatarsal foot fracture. She is neurovascularly intact without deformity, bruising or swelling. He shouldn't has crutches at home. Placed in cam walker boot, follow up with [facility] as soon as possible. Rice protocol. Her safe for discharge at this time. Neurovascularly intact after splint placement.  I personally performed the services described in this documentation, which was scribed in my presence. The recorded information has been reviewed and is accurate.       Arthor CaptainAbigail Jermery Caratachea, PA-C 11/30/14 2210  Mirian MoMatthew Gentry, MD 12/06/14 [phone]

## 2014-11-30 NOTE — Discharge Instructions (Signed)
Tramadol tablets What is this medicine? TRAMADOL (TRA ma dole) is a pain reliever. It is used to treat moderate to severe pain in adults. This medicine may be used for other purposes; ask your health care provider or pharmacist if you have questions. What should I tell my health care provider before I take this medicine? They need to know if you have any of these conditions: -brain tumor -depression -drug abuse or addiction -head injury -if you frequently drink alcohol containing drinks -kidney disease or trouble passing urine -liver disease -lung disease, asthma, or breathing problems -seizures or epilepsy -suicidal thoughts, plans, or attempt; a previous suicide attempt by you or a family member -an unusual or allergic reaction to tramadol, codeine, other medicines, foods, dyes, or preservatives -pregnant or trying to get pregnant -breast-feeding How should I use this medicine? Take this medicine by mouth with a full glass of water. Follow the directions on the prescription label. If the medicine upsets your stomach, take it with food or milk. Do not take more medicine than you are told to take. Talk to your pediatrician regarding the use of this medicine in children. Special care may be needed. Overdosage: If you think you have taken too much of this medicine contact a poison control center or emergency room at once. NOTE: This medicine is only for you. Do not share this medicine with others. What if I miss a dose? If you miss a dose, take it as soon as you can. If it is almost time for your next dose, take only that dose. Do not take double or extra doses. What may interact with this medicine? Do not take this medicine with any of the following medications: -MAOIs like Carbex, Eldepryl, Marplan, Nardil, and Parnate This medicine may also interact with the following medications: -alcohol or medicines that contain alcohol -antihistamines -benzodiazepines -bupropion -carbamazepine  or oxcarbazepine -clozapine -cyclobenzaprine -digoxin -furazolidone -linezolid -medicines for depression, anxiety, or psychotic disturbances -medicines for migraine headache like almotriptan, eletriptan, frovatriptan, naratriptan, rizatriptan, sumatriptan, zolmitriptan -medicines for pain like pentazocine, buprenorphine, butorphanol, meperidine, nalbuphine, and propoxyphene -medicines for sleep -muscle relaxants -naltrexone -phenobarbital -phenothiazines like perphenazine, thioridazine, chlorpromazine, mesoridazine, fluphenazine, prochlorperazine, promazine, and trifluoperazine -procarbazine -warfarin This list may not describe all possible interactions. Give your health care provider a list of all the medicines, herbs, non-prescription drugs, or dietary supplements you use. Also tell them if you smoke, drink alcohol, or use illegal drugs. Some items may interact with your medicine. What should I watch for while using this medicine? Tell your doctor or health care professional if your pain does not go away, if it gets worse, or if you have new or a different type of pain. You may develop tolerance to the medicine. Tolerance means that you will need a higher dose of the medicine for pain relief. Tolerance is normal and is expected if you take this medicine for a long time. Do not suddenly stop taking your medicine because you may develop a severe reaction. Your body becomes used to the medicine. This does NOT mean you are addicted. Addiction is a behavior related to getting and using a drug for a non-medical reason. If you have pain, you have a medical reason to take pain medicine. Your doctor will tell you how much medicine to take. If your doctor wants you to stop the medicine, the dose will be slowly lowered over time to avoid any side effects. You may get drowsy or dizzy. Do not drive, use machinery, or do anything  that needs mental alertness until you know how this medicine affects you. Do  not stand or sit up quickly, especially if you are an older patient. This reduces the risk of dizzy or fainting spells. Alcohol can increase or decrease the effects of this medicine. Avoid alcoholic drinks. You may have constipation. Try to have a bowel movement at least every 2 to 3 days. If you do not have a bowel movement for 3 days, call your doctor or health care professional. Your mouth may get dry. Chewing sugarless gum or sucking hard candy, and drinking plenty of water may help. Contact your doctor if the problem does not go away or is severe. What side effects may I notice from receiving this medicine? Side effects that you should report to your doctor or health care professional as soon as possible: -allergic reactions like skin rash, itching or hives, swelling of the face, lips, or tongue -breathing difficulties, wheezing -confusion -itching -light headedness or fainting spells -redness, blistering, peeling or loosening of the skin, including inside the mouth -seizures Side effects that usually do not require medical attention (report to your doctor or health care professional if they continue or are bothersome): -constipation -dizziness -drowsiness -headache -nausea, vomiting This list may not describe all possible side effects. Call your doctor for medical advice about side effects. You may report side effects to FDA at 1-800-FDA-1088. Where should I keep my medicine? Keep out of the reach of children. This medicine may cause accidental overdose and death if it taken by other adults, children, or pets. Mix any unused medicine with a substance like cat litter or coffee grounds. Then throw the medicine away in a sealed container like a sealed bag or a coffee can with a lid. Do not use the medicine after the expiration date. Store at room temperature between 15 and 30 degrees C (59 and 86 degrees F). NOTE: This sheet is a summary. It may not cover all possible information. If you  have questions about this medicine, talk to your doctor, pharmacist, or health care provider.    2016, Elsevier/Gold Standard. (2013-03-17 15:42:09) Metatarsal Fracture A metatarsal fracture is a break in a metatarsal bone. Metatarsal bones connect your toe bones to your ankle bones. CAUSES This type of fracture may be caused by:  A sudden twisting of your foot.  A fall onto your foot.  Overuse or repetitive exercise. RISK FACTORS This condition is more likely to develop in people who:  Play contact sports.  Have a bone disease.  Have a low calcium level. SYMPTOMS Symptoms of this condition include:  Pain that is worse when walking or standing.  Pain when pressing on the foot or moving the toes.  Swelling.  Bruising on the top or bottom of the foot.  A foot that appears shorter than the other one. DIAGNOSIS This condition is diagnosed with a physical exam. You may also have imaging tests, such as:  X-rays.  A CT scan.  MRI. TREATMENT Treatment for this condition depends on its severity and whether a bone has moved out of place. Treatment may involve:  Rest.  Wearing foot support such as a cast, splint, or boot for several weeks.  Using crutches.  Surgery to move bones back into the right position. Surgery is usually needed if there are many pieces of broken bone or bones that are very out of place (displaced fracture).  Physical therapy. This may be needed to help you regain full movement and strength in your  foot. You will need to return to your health care provider to have X-rays taken until your bones heal. Your health care provider will look at the X-rays to make sure that your foot is healing well. HOME CARE INSTRUCTIONS  If You Have a Cast:  Do not stick anything inside the cast to scratch your skin. Doing that increases your risk of infection.  Check the skin around the cast every day. Report any concerns to your health care provider. You may put  lotion on dry skin around the edges of the cast. Do not apply lotion to the skin underneath the cast.  Keep the cast clean and dry. If You Have a Splint or a Supportive Boot:  Wear it as directed by your health care provider. Remove it only as directed by your health care provider.  Loosen it if your toes become numb and tingle, or if they turn cold and blue.  Keep it clean and dry. Bathing  Do not take baths, swim, or use a hot tub until your health care provider approves. Ask your health care provider if you can take showers. You may only be allowed to take sponge baths for bathing.  If your health care provider approves bathing and showering, cover the cast or splint with a watertight plastic bag to protect it from water. Do not let the cast or splint get wet. Managing Pain, Stiffness, and Swelling  If directed, apply ice to the injured area (if you have a splint, not a cast).  Put ice in a plastic bag.  Place a towel between your skin and the bag.  Leave the ice on for 20 minutes, 2-3 times per day.  Move your toes often to avoid stiffness and to lessen swelling.  Raise (elevate) the injured area above the level of your heart while you are sitting or lying down. Driving  Do not drive or operate heavy machinery while taking pain medicine.  Do not drive while wearing foot support on a foot that you use for driving. Activity  Return to your normal activities as directed by your health care provider. Ask your health care provider what activities are safe for you.  Perform exercises as directed by your health care provider or physical therapist. Safety  Do not use the injured foot to support your body weight until your health care provider says that you can. Use crutches as directed by your health care provider. General Instructions  Do not put pressure on any part of the cast or splint until it is fully hardened. This may take several hours.  Do not use any tobacco  products, including cigarettes, chewing tobacco, or e-cigarettes. Tobacco can delay bone healing. If you need help quitting, ask your health care provider.  Take medicines only as directed by your health care provider.  Keep all follow-up visits as directed by your health care provider. This is important. SEEK MEDICAL CARE IF:  You have a fever.  Your cast, splint, or boot is too loose or too tight.  Your cast, splint, or boot is damaged.  Your pain medicine is not helping.  You have pain, tingling, or numbness in your foot that is not going away. SEEK IMMEDIATE MEDICAL CARE IF:  You have severe pain.  You have tingling or numbness in your foot that is getting worse.  Your foot feels cold or becomes numb.  Your foot changes color.   This information is not intended to replace advice given to you by your  health care provider. Make sure you discuss any questions you have with your health care provider.   Document Released: 10/11/2001 Document Revised: 06/05/2014 Document Reviewed: 11/15/2013 Elsevier Interactive Patient Education 2016 Elsevier Inc. Naproxen and naproxen sodium oral immediate-release tablets What is this medicine? NAPROXEN (na PROX en) is a non-steroidal anti-inflammatory drug (NSAID). It is used to reduce swelling and to treat pain. This medicine may be used for dental pain, headache, or painful monthly periods. It is also used for painful joint and muscular problems such as arthritis, tendinitis, bursitis, and gout. This medicine may be used for other purposes; ask your health care provider or pharmacist if you have questions. What should I tell my health care provider before I take this medicine? They need to know if you have any of these conditions: -asthma -cigarette smoker -drink more than 3 alcohol containing drinks a day -heart disease or circulation problems such as heart failure or leg edema (fluid retention) -high blood pressure -kidney  disease -liver disease -stomach bleeding or ulcers -an unusual or allergic reaction to naproxen, aspirin, other NSAIDs, other medicines, foods, dyes, or preservatives -pregnant or trying to get pregnant -breast-feeding How should I use this medicine? Take this medicine by mouth with a glass of water. Follow the directions on the prescription label. Take it with food if your stomach gets upset. Try to not lie down for at least 10 minutes after you take it. Take your medicine at regular intervals. Do not take your medicine more often than directed. Long-term, continuous use may increase the risk of heart attack or stroke. A special MedGuide will be given to you by the pharmacist with each prescription and refill. Be sure to read this information carefully each time. Talk to your pediatrician regarding the use of this medicine in children. Special care may be needed. Overdosage: If you think you have taken too much of this medicine contact a poison control center or emergency room at once. NOTE: This medicine is only for you. Do not share this medicine with others. What if I miss a dose? If you miss a dose, take it as soon as you can. If it is almost time for your next dose, take only that dose. Do not take double or extra doses. What may interact with this medicine? -alcohol -aspirin -cidofovir -diuretics -lithium -methotrexate -other drugs for inflammation like ketorolac or prednisone -pemetrexed -probenecid -warfarin This list may not describe all possible interactions. Give your health care provider a list of all the medicines, herbs, non-prescription drugs, or dietary supplements you use. Also tell them if you smoke, drink alcohol, or use illegal drugs. Some items may interact with your medicine. What should I watch for while using this medicine? Tell your doctor or health care professional if your pain does not get better. Talk to your doctor before taking another medicine for pain. Do  not treat yourself. This medicine does not prevent heart attack or stroke. In fact, this medicine may increase the chance of a heart attack or stroke. The chance may increase with longer use of this medicine and in people who have heart disease. If you take aspirin to prevent heart attack or stroke, talk with your doctor or health care professional. Do not take other medicines that contain aspirin, ibuprofen, or naproxen with this medicine. Side effects such as stomach upset, nausea, or ulcers may be more likely to occur. Many medicines available without a prescription should not be taken with this medicine. This medicine can cause ulcers  and bleeding in the stomach and intestines at any time during treatment. Do not smoke cigarettes or drink alcohol. These increase irritation to your stomach and can make it more susceptible to damage from this medicine. Ulcers and bleeding can happen without warning symptoms and can cause death. You may get drowsy or dizzy. Do not drive, use machinery, or do anything that needs mental alertness until you know how this medicine affects you. Do not stand or sit up quickly, especially if you are an older patient. This reduces the risk of dizzy or fainting spells. This medicine can cause you to bleed more easily. Try to avoid damage to your teeth and gums when you brush or floss your teeth. What side effects may I notice from receiving this medicine? Side effects that you should report to your doctor or health care professional as soon as possible: -black or bloody stools, blood in the urine or vomit -blurred vision -chest pain -difficulty breathing or wheezing -nausea or vomiting -severe stomach pain -skin rash, skin redness, blistering or peeling skin, hives, or itching -slurred speech or weakness on one side of the body -swelling of eyelids, throat, lips -unexplained weight gain or swelling -unusually weak or tired -yellowing of eyes or skin Side effects that  usually do not require medical attention (report to your doctor or health care professional if they continue or are bothersome): -constipation -headache -heartburn This list may not describe all possible side effects. Call your doctor for medical advice about side effects. You may report side effects to FDA at 1-800-FDA-1088. Where should I keep my medicine? Keep out of the reach of children. Store at room temperature between 15 and 30 degrees C (59 and 86 degrees F). Keep container tightly closed. Throw away any unused medicine after the expiration date. NOTE: This sheet is a summary. It may not cover all possible information. If you have questions about this medicine, talk to your doctor, pharmacist, or health care provider.    2016, Elsevier/Gold Standard. (2009-01-21 20:10:16)

## 2019-07-27 ENCOUNTER — Other Ambulatory Visit (HOSPITAL_COMMUNITY): Payer: 59

## 2019-07-31 ENCOUNTER — Ambulatory Visit (HOSPITAL_BASED_OUTPATIENT_CLINIC_OR_DEPARTMENT_OTHER): Admission: RE | Admit: 2019-07-31 | Payer: 59 | Source: Home / Self Care | Admitting: Obstetrics and Gynecology

## 2019-07-31 SURGERY — LIGATION, FALLOPIAN TUBE, LAPAROSCOPIC
Anesthesia: General
